# Patient Record
Sex: Male | Born: 1995
Health system: Southern US, Community
[De-identification: ages and names within clinical notes are randomized; demographics above are authoritative.]

---
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
## 2015-12-07 DIAGNOSIS — M546 Pain in thoracic spine: Secondary | ICD-10-CM | POA: Diagnosis not present

## 2015-12-07 DIAGNOSIS — M25521 Pain in right elbow: Secondary | ICD-10-CM | POA: Diagnosis not present

## 2016-01-28 DIAGNOSIS — L700 Acne vulgaris: Secondary | ICD-10-CM | POA: Diagnosis not present

## 2016-07-05 DIAGNOSIS — Z6824 Body mass index (BMI) 24.0-24.9, adult: Secondary | ICD-10-CM | POA: Diagnosis not present

## 2016-07-05 DIAGNOSIS — L709 Acne, unspecified: Secondary | ICD-10-CM | POA: Diagnosis not present

## 2016-07-05 DIAGNOSIS — Z1389 Encounter for screening for other disorder: Secondary | ICD-10-CM | POA: Diagnosis not present

## 2016-07-05 DIAGNOSIS — J309 Allergic rhinitis, unspecified: Secondary | ICD-10-CM | POA: Diagnosis not present

## 2017-03-18 DIAGNOSIS — Z0389 Encounter for observation for other suspected diseases and conditions ruled out: Secondary | ICD-10-CM | POA: Diagnosis not present

## 2017-07-11 DIAGNOSIS — S63631A Sprain of interphalangeal joint of left index finger, initial encounter: Secondary | ICD-10-CM | POA: Diagnosis not present

## 2017-07-11 DIAGNOSIS — M79645 Pain in left finger(s): Secondary | ICD-10-CM | POA: Diagnosis not present

## 2017-07-11 DIAGNOSIS — M25442 Effusion, left hand: Secondary | ICD-10-CM | POA: Diagnosis not present

## 2017-07-14 DIAGNOSIS — M79645 Pain in left finger(s): Secondary | ICD-10-CM | POA: Diagnosis not present

## 2017-07-14 DIAGNOSIS — M25642 Stiffness of left hand, not elsewhere classified: Secondary | ICD-10-CM | POA: Diagnosis not present

## 2017-07-14 DIAGNOSIS — R29898 Other symptoms and signs involving the musculoskeletal system: Secondary | ICD-10-CM | POA: Diagnosis not present

## 2018-05-16 DIAGNOSIS — L700 Acne vulgaris: Secondary | ICD-10-CM | POA: Diagnosis not present

## 2018-05-28 ENCOUNTER — Telehealth: Payer: Self-pay | Admitting: Family Medicine

## 2018-05-28 NOTE — Telephone Encounter (Signed)
Copied from CRM [id number]. 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

## 2018-05-28 NOTE — Telephone Encounter (Signed)
Patient scheduled.

## 2018-06-01 DIAGNOSIS — Z0189 Encounter for other specified special examinations: Secondary | ICD-10-CM | POA: Diagnosis not present

## 2018-06-01 DIAGNOSIS — Z0389 Encounter for observation for other suspected diseases and conditions ruled out: Secondary | ICD-10-CM | POA: Diagnosis not present

## 2018-06-11 DIAGNOSIS — M5416 Radiculopathy, lumbar region: Secondary | ICD-10-CM | POA: Diagnosis not present

## 2018-06-11 DIAGNOSIS — M545 Low back pain: Secondary | ICD-10-CM | POA: Diagnosis not present

## 2018-06-13 ENCOUNTER — Ambulatory Visit: Payer: BLUE CROSS/BLUE SHIELD | Admitting: Family Medicine

## 2018-06-13 ENCOUNTER — Ambulatory Visit (INDEPENDENT_AMBULATORY_CARE_PROVIDER_SITE_OTHER)
Admission: RE | Admit: 2018-06-13 | Discharge: 2018-06-13 | Disposition: A | Discharge status: Home / Self Care | Payer: BLUE CROSS/BLUE SHIELD | Source: Ambulatory Visit | Attending: Family Medicine | Admitting: Family Medicine

## 2018-06-13 ENCOUNTER — Encounter: Payer: Self-pay | Admitting: Family Medicine

## 2018-06-13 ENCOUNTER — Encounter (INDEPENDENT_AMBULATORY_CARE_PROVIDER_SITE_OTHER): Payer: Self-pay

## 2018-06-13 VITALS — BP 112/76 | HR 73 | Ht 75.0 in | Wt 202.0 lb

## 2018-06-13 DIAGNOSIS — M999 Biomechanical lesion, unspecified: Secondary | ICD-10-CM | POA: Insufficient documentation

## 2018-06-13 DIAGNOSIS — M545 Low back pain, unspecified: Secondary | ICD-10-CM

## 2018-06-13 DIAGNOSIS — G8929 Other chronic pain: Secondary | ICD-10-CM

## 2018-06-13 MED ORDER — VITAMIN D (ERGOCALCIFEROL) 1.25 MG (50000 UNIT) PO CAPS: 50000.0000 [IU] | ORAL_CAPSULE | ORAL | 0 refills | Status: AC

## 2018-06-13 NOTE — Assessment & Plan Note (Signed)
Seems to be more of a sacroiliac dysfunction.  Given home exercises.  X-rays ordered today and flexion-extension to rule out spondylolisthesis.  Once weekly vitamin D given, discussed topical anti-inflammatories, icing regimen.  Patient is a Pensions consultantcollegiate baseball player and we will monitor for patient symptoms but patient will be out of state.  We will try to do as much as we can from email and then further evaluation patient back in town.  Patient is agreement with the plan.  Follow-up again in 4 weeks

## 2018-06-13 NOTE — Progress Notes (Signed)
[facility] [address] [place], [place] [address] Phone: [phone] Subjective:    I Ronelle Nigh am serving as a Neurosurgeon for Dr. Antoine Primas.    CC: back pain   QIO:[id number]  Joshua Mills is a 22 y.o. male coming in with complaint of lower back pain. Plays college baseball. Left side. No numbness and tingling. Hamstrings are really tight.   Onset- 1 month Location Duration-  Character- sharp Aggravating factors- sitting Reliving factors- Advil  Therapies tried-  Severity-7 out of 10     History reviewed. No pertinent past medical history. History reviewed. No pertinent surgical history. Social History   Socioeconomic History  . Marital status: Single    Spouse name: Not on file  . Number of children: Not on file  . Years of education: Not on file  . Highest education level: Not on file  Occupational History  . Not on file  Social Needs  . Financial resource strain: Not on file  . Food insecurity:    Worry: Not on file    Inability: Not on file  . Transportation needs:    Medical: Not on file    Non-medical: Not on file  Tobacco Use  . Smoking status: Never Smoker  . Smokeless tobacco: Never Used  Substance and Sexual Activity  . Alcohol use: Not on file  . Drug use: Not on file  . Sexual activity: Not on file  Lifestyle  . Physical activity:    Days per week: Not on file    Minutes per session: Not on file  . Stress: Not on file  Relationships  . Social connections:    Talks on phone: Not on file    Gets together: Not on file    Attends religious service: Not on file    Active member of club or organization: Not on file    Attends meetings of clubs or organizations: Not on file    Relationship status: Not on file  Other Topics Concern  . Not on file  Social History Narrative  . Not on file   Not on File History reviewed. No pertinent family history.       Current Outpatient Medications (Other):  Marland Kitchen   Vitamin D, Ergocalciferol, (DRISDOL) 1.25 MG (50000 UT) CAPS capsule, Take 1 capsule (50,000 Units total) by mouth every 7 (seven) days.    Past medical history, social, surgical and family history all reviewed in electronic medical record.  No pertanent information unless stated regarding to the chief complaint.   Review of Systems:  No headache, visual changes, nausea, vomiting, diarrhea, constipation, dizziness, abdominal pain, skin rash, fevers, chills, night sweats, weight loss, swollen lymph nodes, body aches, joint swelling, muscle aches, chest pain, shortness of breath, mood changes.   Objective  Blood pressure 112/76, pulse 73, height 6\' 3"  (1.905 m), weight 202 lb (91.6 kg), SpO2 97 %.    General: No apparent distress alert and oriented x3 mood and affect normal, dressed appropriately.  HEENT: Pupils equal, extraocular movements intact  Respiratory: Patient's speak in full sentences and does not appear short of breath  Cardiovascular: No lower extremity edema, non tender, no erythema  Skin: Warm dry intact with no signs of infection or rash on extremities or on axial skeleton.  Abdomen: Soft nontender  Neuro: Cranial nerves II through XII are intact, neurovascularly intact in all extremities with 2+ DTRs and 2+ pulses.  Lymph: No lymphadenopathy of posterior or anterior cervical  chain or axillae bilaterally.  Gait normal with good balance and coordination.  MSK:  Non tender with full range of motion and good stability and symmetric strength and tone of shoulders, elbows, wrist, hip, knee and ankles bilaterally.  Back Exam:  Inspection: Mild loss of lordosis Motion: Flexion 45 deg, Extension 15 deg, Side Bending to 35 deg bilaterally,  Rotation to 45 deg bilaterally  SLR laying: Tightness on the left more than the right XSLR laying: Negative  Palpable tenderness: Tender to palpation the paraspinal musculature lumbar spine right greater than left. FABER: Mild more tightness to  with the [facility] on the left. Sensory change: Gross sensation intact to all lumbar and sacral dermatomes.  Reflexes: 2+ at both patellar tendons, 2+ at achilles tendons, Babinski's downgoing.  Strength at foot  Plantar-flexion: 5/5 Dorsi-flexion: 5/5 Eversion: 5/5 Inversion: 4/5 but symmetric Leg strength  Quad: 5/5 Hamstring: 5/5 Hip flexor: 5/5 Hip abductors: 5/5  Gait unremarkable.  Osteopathic findings  C2 flexed rotated and side bent right T3 extended rotated and side bent right inhaled third rib T9 extended rotated and side bent left L2 flexed rotated and side bent right Sacrum left on left    97110; 15 additional minutes spent for Therapeutic exercises as stated in above notes.  This included exercises focusing on stretching, strengthening, with significant focus on eccentric aspects.   Long term goals include an improvement in range of motion, strength, endurance as well as avoiding reinjury. Patient's frequency would include in 1-2 times a day, 3-5 times a week for a duration of 6-12 weeks.  Sacroiliac Joint Mobilization and Rehab 1. Work on pretzel stretching, shoulder back and leg draped in front. 3-5 sets, 30 sec.. 2. hip abductor rotations. standing, hip flexion and rotation outward then inward. 3 sets, 15 reps. when can do comfortably, add ankle weights starting at 2 pounds.  3. cross over stretching - shoulder back to ground, same side leg crossover. 3-5 sets for 30 min..  4. rolling up and back knees to chest and rocking. 5. sacral tilt - 5 sets, hold for 5-10 seconds Proper technique shown and discussed handout in great detail with ATC.  All questions were discussed and answered.   Impression and Recommendations:     This case required medical decision making of moderate complexity. The above documentation has been reviewed and is accurate and complete Joshua SaaZachary M Sanora Cunanan, DO       Note: This dictation was prepared with Dragon dictation along with smaller phrase  technology. Any transcriptional errors that result from this process are unintentional.

## 2018-06-13 NOTE — Assessment & Plan Note (Signed)
Decision today to treat with OMT was based on Physical Exam  After verbal consent patient was treated with HVLA, ME, FPR techniques in cervical, thoracic, rib lumbar and sacral areas  Patient tolerated the procedure well with improvement in symptoms  Patient given exercises, stretches and lifestyle modifications  See medications in patient instructions if given  Patient will follow up in 4 weeks 

## 2018-06-13 NOTE — Patient Instructions (Addendum)
Good to see you  Ice 20 minutes 2 times daily. Usually after activity and before bed. Exercises 3 times a week.  We will do mobilization for the SI joint and talk to the trainer Will get xrays downstairs Once weekly vitamin D for 12 weeks Send me a message in 3 weeks and tell me how you are doing .  Then we will discuss follow up  Happy holidays!

## 2018-06-16 DIAGNOSIS — M545 Low back pain: Secondary | ICD-10-CM | POA: Diagnosis not present

## 2018-06-21 DIAGNOSIS — M5416 Radiculopathy, lumbar region: Secondary | ICD-10-CM | POA: Diagnosis not present

## 2018-06-21 DIAGNOSIS — M5126 Other intervertebral disc displacement, lumbar region: Secondary | ICD-10-CM | POA: Diagnosis not present

## 2018-06-21 DIAGNOSIS — M545 Low back pain: Secondary | ICD-10-CM | POA: Diagnosis not present

## 2019-01-30 ENCOUNTER — Other Ambulatory Visit: Payer: Self-pay

## 2019-01-30 DIAGNOSIS — Z20822 Contact with and (suspected) exposure to covid-19: Secondary | ICD-10-CM

## 2019-01-31 LAB — NOVEL CORONAVIRUS, NAA: SARS-CoV-2, NAA: NOT DETECTED

## 2019-04-02 DIAGNOSIS — Z20828 Contact with and (suspected) exposure to other viral communicable diseases: Secondary | ICD-10-CM | POA: Diagnosis not present

## 2019-04-24 ENCOUNTER — Other Ambulatory Visit: Payer: Self-pay

## 2019-04-24 DIAGNOSIS — Z20822 Contact with and (suspected) exposure to covid-19: Secondary | ICD-10-CM

## 2019-04-26 LAB — NOVEL CORONAVIRUS, NAA: SARS-CoV-2, NAA: NOT DETECTED

## 2019-06-20 DIAGNOSIS — Z03818 Encounter for observation for suspected exposure to other biological agents ruled out: Secondary | ICD-10-CM | POA: Diagnosis not present

## 2019-07-09 DIAGNOSIS — Z8616 Personal history of COVID-19: Secondary | ICD-10-CM | POA: Diagnosis not present

## 2019-07-09 DIAGNOSIS — Z025 Encounter for examination for participation in sport: Secondary | ICD-10-CM | POA: Diagnosis not present

## 2019-10-02 DIAGNOSIS — M545 Low back pain: Secondary | ICD-10-CM | POA: Diagnosis not present

## 2020-01-31 DIAGNOSIS — M79671 Pain in right foot: Secondary | ICD-10-CM | POA: Diagnosis not present

## 2020-01-31 DIAGNOSIS — M79672 Pain in left foot: Secondary | ICD-10-CM | POA: Diagnosis not present

## 2020-01-31 DIAGNOSIS — S93525A Sprain of metatarsophalangeal joint of left lesser toe(s), initial encounter: Secondary | ICD-10-CM | POA: Diagnosis not present

## 2020-01-31 DIAGNOSIS — S93524A Sprain of metatarsophalangeal joint of right lesser toe(s), initial encounter: Secondary | ICD-10-CM | POA: Diagnosis not present

## 2020-02-20 DIAGNOSIS — Z0389 Encounter for observation for other suspected diseases and conditions ruled out: Secondary | ICD-10-CM | POA: Diagnosis not present

## 2020-02-20 DIAGNOSIS — Z20822 Contact with and (suspected) exposure to covid-19: Secondary | ICD-10-CM | POA: Diagnosis not present

## 2020-02-20 DIAGNOSIS — S199XXA Unspecified injury of neck, initial encounter: Secondary | ICD-10-CM | POA: Diagnosis not present

## 2020-02-20 DIAGNOSIS — S01111A Laceration without foreign body of right eyelid and periocular area, initial encounter: Secondary | ICD-10-CM | POA: Diagnosis not present

## 2020-02-20 DIAGNOSIS — S80211A Abrasion, right knee, initial encounter: Secondary | ICD-10-CM | POA: Diagnosis not present

## 2020-02-20 DIAGNOSIS — S098XXA Other specified injuries of head, initial encounter: Secondary | ICD-10-CM | POA: Diagnosis not present

## 2020-02-20 DIAGNOSIS — S8012XA Contusion of left lower leg, initial encounter: Secondary | ICD-10-CM | POA: Diagnosis not present

## 2020-02-20 DIAGNOSIS — S0081XA Abrasion of other part of head, initial encounter: Secondary | ICD-10-CM | POA: Diagnosis not present

## 2020-02-20 DIAGNOSIS — S0993XA Unspecified injury of face, initial encounter: Secondary | ICD-10-CM | POA: Diagnosis not present

## 2020-02-20 DIAGNOSIS — S0910XA Unspecified injury of muscle and tendon of head, initial encounter: Secondary | ICD-10-CM | POA: Diagnosis not present

## 2020-02-20 DIAGNOSIS — S0181XA Laceration without foreign body of other part of head, initial encounter: Secondary | ICD-10-CM | POA: Diagnosis not present

## 2020-02-20 DIAGNOSIS — S80212A Abrasion, left knee, initial encounter: Secondary | ICD-10-CM | POA: Diagnosis not present

## 2020-05-08 DIAGNOSIS — S0910XA Unspecified injury of muscle and tendon of head, initial encounter: Secondary | ICD-10-CM | POA: Diagnosis not present

## 2020-05-08 DIAGNOSIS — S0993XA Unspecified injury of face, initial encounter: Secondary | ICD-10-CM | POA: Diagnosis not present

## 2020-05-08 DIAGNOSIS — S0081XA Abrasion of other part of head, initial encounter: Secondary | ICD-10-CM | POA: Diagnosis not present

## 2020-05-08 DIAGNOSIS — S01111A Laceration without foreign body of right eyelid and periocular area, initial encounter: Secondary | ICD-10-CM | POA: Diagnosis not present

## 2020-05-18 NOTE — Progress Notes (Deleted)
[facility] [address] [place] [address] Phone: [phone] Subjective:    I'm seeing this patient by the request  of:  Pcp, No  CC:   FTD:[id number]  Joshua Mills is a 24 y.o. male coming in with complaint of calf pain. Last seen for OMT in 2019. Patient states       No past medical history on file. No past surgical history on file. Social History   Socioeconomic History  . Marital status: Single    Spouse name: Not on file  . Number of children: Not on file  . Years of education: Not on file  . Highest education level: Not on file  Occupational History  . Not on file  Tobacco Use  . Smoking status: Never Smoker  . Smokeless tobacco: Never Used  Substance and Sexual Activity  . Alcohol use: Not on file  . Drug use: Not on file  . Sexual activity: Not on file  Other Topics Concern  . Not on file  Social History Narrative  . Not on file   Social Determinants of Health   Financial Resource Strain:   . Difficulty of Paying Living Expenses: Not on file  Food Insecurity:   . Worried About Programme researcher, broadcasting/film/video in the Last Year: Not on file  . Ran Out of Food in the Last Year: Not on file  Transportation Needs:   . Lack of Transportation (Medical): Not on file  . Lack of Transportation (Non-Medical): Not on file  Physical Activity:   . Days of Exercise per Week: Not on file  . Minutes of Exercise per Session: Not on file  Stress:   . Feeling of Stress : Not on file  Social Connections:   . Frequency of Communication with Friends and Family: Not on file  . Frequency of Social Gatherings with Friends and Family: Not on file  . Attends Religious Services: Not on file  . Active Member of Clubs or Organizations: Not on file  . Attends Banker Meetings: Not on file  . Marital Status: Not on file   Not on File No family history on file.       Current Outpatient Medications (Other):  Marland Kitchen  Vitamin D,  Ergocalciferol, (DRISDOL) 1.25 MG (50000 UT) CAPS capsule, Take 1 capsule (50,000 Units total) by mouth every 7 (seven) days.   Reviewed prior external information including notes and imaging from  primary care provider As well as notes that were available from care everywhere and other healthcare systems.  Past medical history, social, surgical and family history all reviewed in electronic medical record.  No pertanent information unless stated regarding to the chief complaint.   Review of Systems:  No headache, visual changes, nausea, vomiting, diarrhea, constipation, dizziness, abdominal pain, skin rash, fevers, chills, night sweats, weight loss, swollen lymph nodes, body aches, joint swelling, chest pain, shortness of breath, mood changes. POSITIVE muscle aches  Objective  There were no vitals taken for this visit.   General: No apparent distress alert and oriented x3 mood and affect normal, dressed appropriately.  HEENT: Pupils equal, extraocular movements intact  Respiratory: Patient's speak in full sentences and does not appear short of breath  Cardiovascular: No lower extremity edema, non tender, no erythema  Neuro: Cranial nerves II through XII are intact, neurovascularly intact in all extremities with 2+ DTRs and 2+ pulses.  Gait normal with good balance and coordination.  MSK:  Non tender with  full range of motion and good stability and symmetric strength and tone of shoulders, elbows, wrist, hip, knee and ankles bilaterally.     Impression and Recommendations:     The above documentation has been reviewed and is accurate and complete Wilford Grist

## 2020-05-19 ENCOUNTER — Encounter: Payer: Self-pay | Admitting: Family Medicine

## 2020-05-19 ENCOUNTER — Ambulatory Visit: Payer: BC Managed Care – PPO | Admitting: Family Medicine

## 2020-05-19 ENCOUNTER — Other Ambulatory Visit: Payer: Self-pay

## 2020-05-19 ENCOUNTER — Ambulatory Visit: Payer: Self-pay

## 2020-05-19 ENCOUNTER — Ambulatory Visit (INDEPENDENT_AMBULATORY_CARE_PROVIDER_SITE_OTHER): Payer: BC Managed Care – PPO | Admitting: Family Medicine

## 2020-05-19 ENCOUNTER — Ambulatory Visit (INDEPENDENT_AMBULATORY_CARE_PROVIDER_SITE_OTHER)
Admission: RE | Admit: 2020-05-19 | Discharge: 2020-05-19 | Disposition: A | Discharge status: Home / Self Care | Payer: BC Managed Care – PPO | Source: Ambulatory Visit | Attending: Family Medicine | Admitting: Family Medicine

## 2020-05-19 VITALS — BP 128/88 | HR 77 | Ht 75.0 in | Wt 195.0 lb

## 2020-05-19 DIAGNOSIS — M545 Low back pain, unspecified: Secondary | ICD-10-CM | POA: Diagnosis not present

## 2020-05-19 DIAGNOSIS — M79662 Pain in left lower leg: Secondary | ICD-10-CM

## 2020-05-19 DIAGNOSIS — M999 Biomechanical lesion, unspecified: Secondary | ICD-10-CM | POA: Diagnosis not present

## 2020-05-19 DIAGNOSIS — M25572 Pain in left ankle and joints of left foot: Secondary | ICD-10-CM

## 2020-05-19 DIAGNOSIS — G8929 Other chronic pain: Secondary | ICD-10-CM

## 2020-05-19 NOTE — Assessment & Plan Note (Signed)
Chronic problem with mild exacerbation.  Negative stork test noted today.  Do not feel that imaging is necessary.  Patient responded well to manipulation.  Increase activity as tolerated.  Follow-up again in 4 to 5 weeks
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
## 2020-05-19 NOTE — Patient Instructions (Addendum)
Compression socks with activity Heat and massage after activity or twice a day  Vit D 4000-5000IU daily Left ankle xray Sp[facility] Total Support Orthotics Back is doing well, increase activity as tolerated See me in 5-6 weeks
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
## 2020-05-19 NOTE — Assessment & Plan Note (Signed)

## 2020-06-04 DIAGNOSIS — Z20822 Contact with and (suspected) exposure to covid-19: Secondary | ICD-10-CM | POA: Diagnosis not present

## 2020-06-04 DIAGNOSIS — Z1159 Encounter for screening for other viral diseases: Secondary | ICD-10-CM | POA: Diagnosis not present

## 2020-06-17 ENCOUNTER — Other Ambulatory Visit: Payer: Self-pay

## 2020-06-17 ENCOUNTER — Ambulatory Visit (INDEPENDENT_AMBULATORY_CARE_PROVIDER_SITE_OTHER): Payer: BC Managed Care – PPO | Admitting: Family Medicine

## 2020-06-17 ENCOUNTER — Encounter: Payer: Self-pay | Admitting: Family Medicine

## 2020-06-17 VITALS — BP 150/78 | HR 73 | Ht 75.0 in | Wt 199.0 lb

## 2020-06-17 DIAGNOSIS — M79662 Pain in left lower leg: Secondary | ICD-10-CM

## 2020-06-17 DIAGNOSIS — M545 Low back pain, unspecified: Secondary | ICD-10-CM | POA: Diagnosis not present

## 2020-06-17 DIAGNOSIS — G8929 Other chronic pain: Secondary | ICD-10-CM

## 2020-06-17 DIAGNOSIS — M999 Biomechanical lesion, unspecified: Secondary | ICD-10-CM
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
## 2020-06-17 NOTE — Assessment & Plan Note (Addendum)
Patient no longer is having pain but still has the abnormality noted.  Patient does states that is not as tight as it was previously.  Is able to workout.  No significant discomfort at all at this time.  Discussed with him again of the possibility of a Doppler.  Everything seems to be superficial.  No significant pain or swelling noted.  I believe the patient can do well and is able to work out at a high level.  No significant calcific changes of the muscle noted.  Finish up with the vitamin D and follow-up with me as needed.  Patient does live in [place] and if anything can follow-up there .

## 2020-06-17 NOTE — Assessment & Plan Note (Signed)
Stable overall.  Does respond well to manipulation.  Attempted osteopathic manipulation again.  As long as patient does well can follow-up with me as needed

## 2020-06-17 NOTE — Assessment & Plan Note (Addendum)
   Decision today to treat with OMT was based on Physical Exam  After verbal consent patient was treated with HVLA, ME, FPR techniques in cervical, thoracic, rib, lumbar and sacral areas, all areas are chronic   Patient tolerated the procedure well with improvement in symptoms  Patient given exercises, stretches and lifestyle modifications  See medications in patient instructions if given  Patient will follow up PRN, patient lives in [place]

## 2020-06-18 DIAGNOSIS — H21233 Degeneration of iris (pigmentary), bilateral: Secondary | ICD-10-CM | POA: Diagnosis not present

## 2020-06-24 DIAGNOSIS — Z0000 Encounter for general adult medical examination without abnormal findings: Secondary | ICD-10-CM | POA: Diagnosis not present

## 2020-06-30 DIAGNOSIS — Z0000 Encounter for general adult medical examination without abnormal findings: Secondary | ICD-10-CM | POA: Diagnosis not present

## 2020-06-30 DIAGNOSIS — R059 Cough, unspecified: Secondary | ICD-10-CM | POA: Diagnosis not present

## 2020-06-30 DIAGNOSIS — Z1331 Encounter for screening for depression: Secondary | ICD-10-CM | POA: Diagnosis not present

## 2021-03-26 DIAGNOSIS — D485 Neoplasm of uncertain behavior of skin: Secondary | ICD-10-CM | POA: Diagnosis not present

## 2021-03-26 DIAGNOSIS — D225 Melanocytic nevi of trunk: Secondary | ICD-10-CM | POA: Diagnosis not present

## 2021-03-26 DIAGNOSIS — D224 Melanocytic nevi of scalp and neck: Secondary | ICD-10-CM | POA: Diagnosis not present

## 2021-03-26 DIAGNOSIS — L578 Other skin changes due to chronic exposure to nonionizing radiation: Secondary | ICD-10-CM | POA: Diagnosis not present

## 2021-06-29 DIAGNOSIS — R7989 Other specified abnormal findings of blood chemistry: Secondary | ICD-10-CM | POA: Diagnosis not present

## 2021-07-01 DIAGNOSIS — Z23 Encounter for immunization: Secondary | ICD-10-CM | POA: Diagnosis not present

## 2022-06-07 IMAGING — DX DG ANKLE COMPLETE 3+V*L*
3 series · 3 of 3 positions shown · non-contrast
Comparison: None.

CLINICAL DATA: Left ankle pain

EXAM:
LEFT ANKLE COMPLETE - 3+ VIEW

[ankle ap]
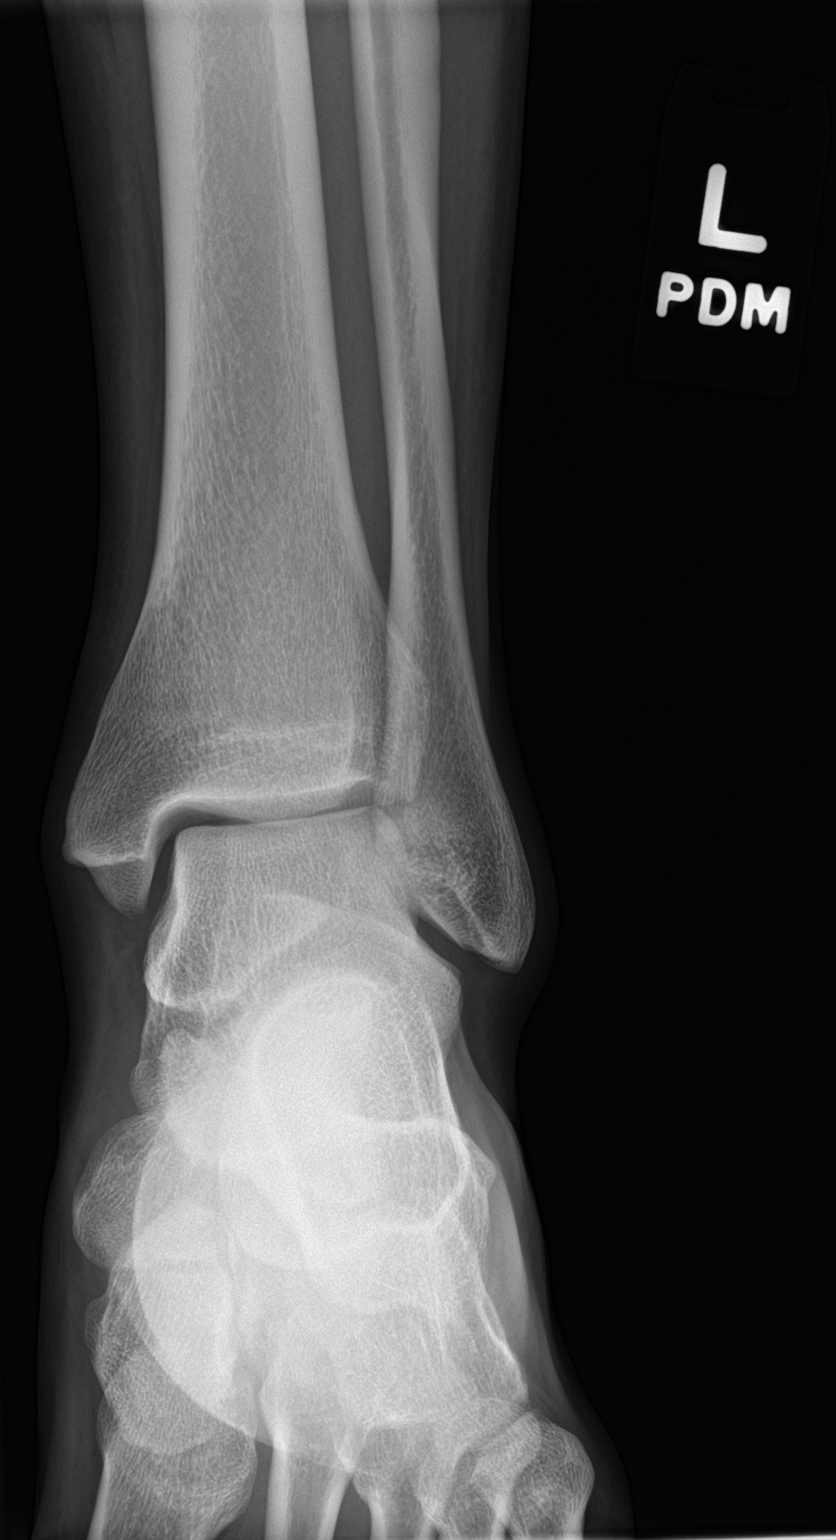

[ankle obl]
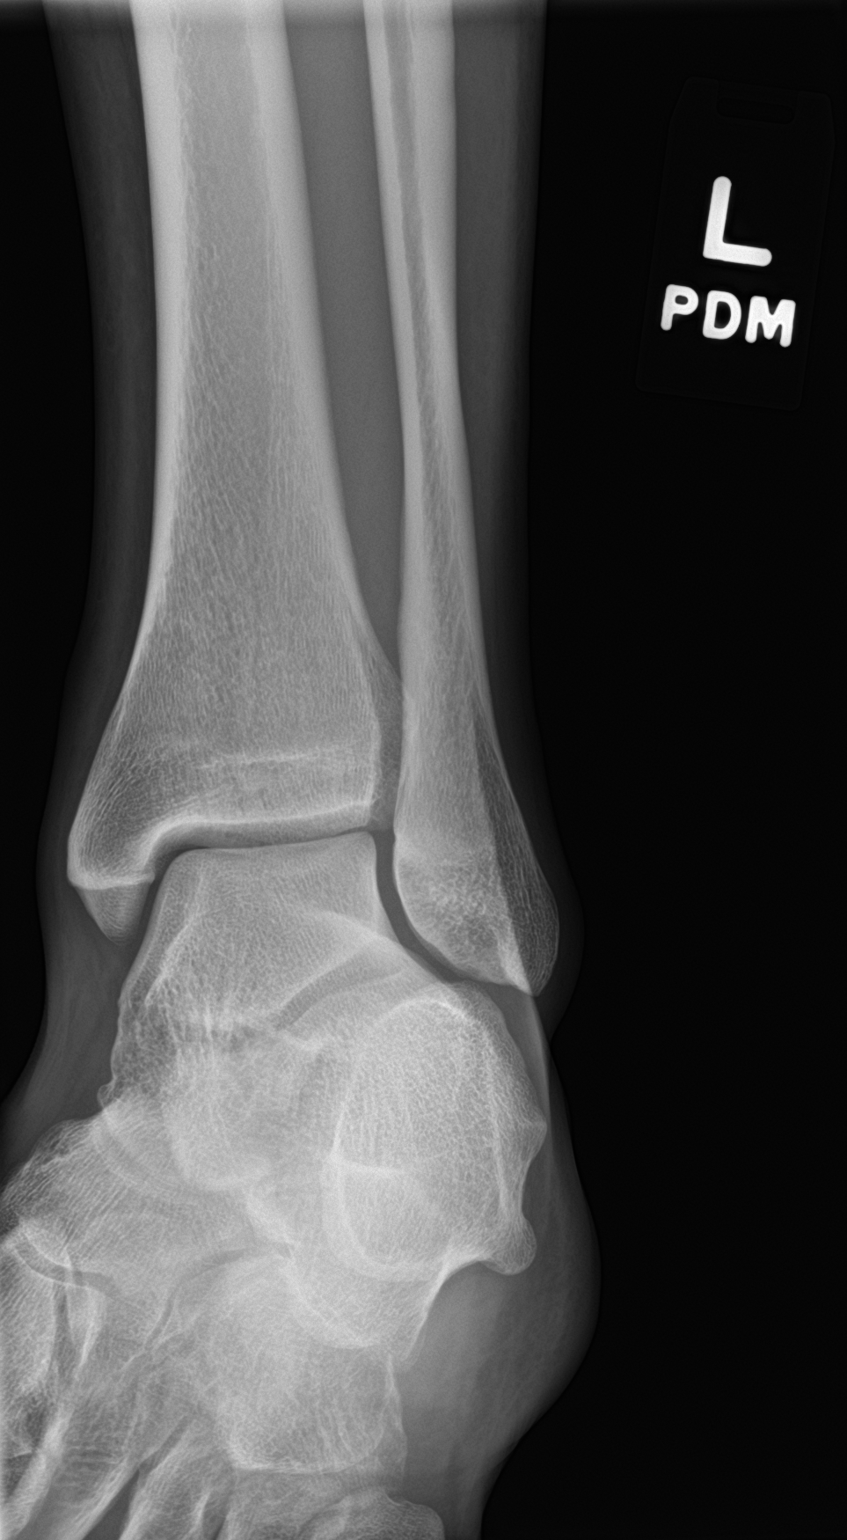

[ankle lat]
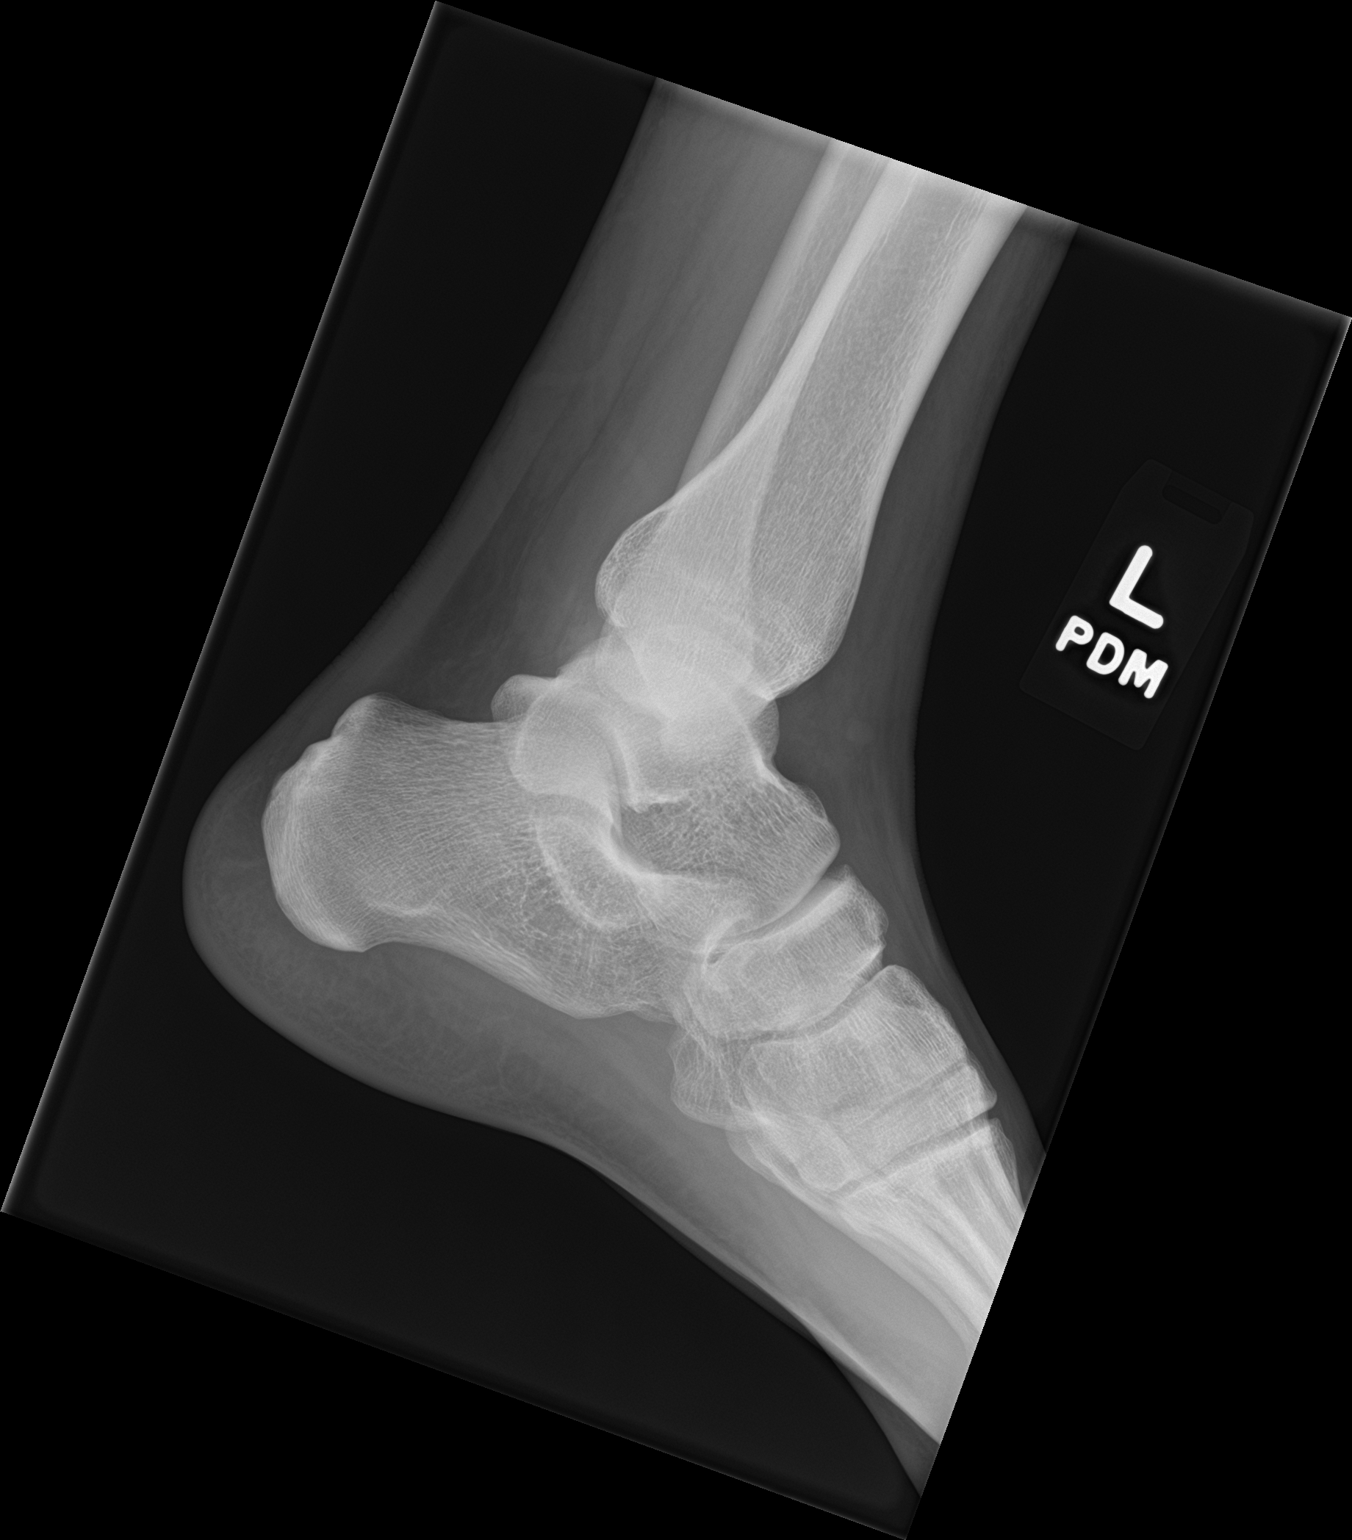

[3 of 3 positions shown; findings below may reference images not displayed]

FINDINGS: There is no evidence of fracture, dislocation, or joint effusion.
There is no evidence of arthropathy or other focal bone abnormality.
Soft tissues are unremarkable.
IMPRESSION: Negative.
